# Patient Record
Sex: Female | Born: 1990 | Race: Black or African American | Hispanic: No | Marital: Single | Smoking: Current every day smoker
Health system: Southern US, Community
[De-identification: ages and names within clinical notes are randomized; demographics above are authoritative.]

## PROBLEM LIST (undated history)

## (undated) DIAGNOSIS — Z8619 Personal history of other infectious and parasitic diseases: Secondary | ICD-10-CM

## (undated) DIAGNOSIS — N946 Dysmenorrhea, unspecified: Secondary | ICD-10-CM

## (undated) DIAGNOSIS — R51 Headache: Secondary | ICD-10-CM

## (undated) HISTORY — DX: Dysmenorrhea, unspecified: N94.6

## (undated) HISTORY — PX: TONSILLECTOMY: SUR1361

## (undated) HISTORY — DX: Headache: R51

## (undated) HISTORY — DX: Personal history of other infectious and parasitic diseases: Z86.19

## (undated) HISTORY — PX: KELOID EXCISION: SHX1856

---
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
## 2012-02-23 ENCOUNTER — Other Ambulatory Visit: Payer: 59

## 2012-02-23 MED ORDER — MEDROXYPROGESTERONE ACETATE 150 MG/ML IM SUSP
150.0000 mg | Freq: Once | INTRAMUSCULAR | Status: AC
  Administered 2012-02-23: 150 mg via INTRAMUSCULAR

## 2012-02-23 NOTE — Progress Notes (Unsigned)
Next Depo due-05-16-2012 

## 2012-02-28 ENCOUNTER — Other Ambulatory Visit: Payer: 59

## 2012-05-17 ENCOUNTER — Other Ambulatory Visit (INDEPENDENT_AMBULATORY_CARE_PROVIDER_SITE_OTHER): Payer: 59

## 2012-05-17 DIAGNOSIS — Z3009 Encounter for other general counseling and advice on contraception: Secondary | ICD-10-CM

## 2012-05-17 MED ORDER — MEDROXYPROGESTERONE ACETATE 150 MG/ML IM SUSP
150.0000 mg | Freq: Once | INTRAMUSCULAR | Status: AC
  Administered 2012-05-17: 150 mg via INTRAMUSCULAR

## 2012-05-17 NOTE — Progress Notes (Unsigned)
Next Depo-Provera due 08/08/12, injection given Right deltoid without difficulty.

## 2012-05-18 ENCOUNTER — Other Ambulatory Visit: Payer: 59

## 2012-08-14 ENCOUNTER — Other Ambulatory Visit: Payer: 59

## 2012-08-14 MED ORDER — MEDROXYPROGESTERONE ACETATE 150 MG/ML IM SUSP
150.0000 mg | Freq: Once | INTRAMUSCULAR | Status: AC
  Administered 2012-08-14: 150 mg via INTRAMUSCULAR

## 2012-08-14 NOTE — Progress Notes (Signed)
Depo given next inj due 11-05-12  ld

## 2012-08-16 ENCOUNTER — Other Ambulatory Visit: Payer: 59

## 2013-05-11 ENCOUNTER — Encounter (HOSPITAL_COMMUNITY): Payer: Self-pay | Admitting: General Practice

## 2013-05-11 ENCOUNTER — Inpatient Hospital Stay (HOSPITAL_COMMUNITY)
Admission: AD | Admit: 2013-05-11 | Discharge: 2013-05-11 | Disposition: A | Discharge status: Home / Self Care | Payer: 59 | Source: Ambulatory Visit | Attending: Obstetrics and Gynecology | Admitting: Obstetrics and Gynecology

## 2013-05-11 DIAGNOSIS — Z30431 Encounter for routine checking of intrauterine contraceptive device: Secondary | ICD-10-CM | POA: Insufficient documentation

## 2013-05-11 DIAGNOSIS — N949 Unspecified condition associated with female genital organs and menstrual cycle: Secondary | ICD-10-CM | POA: Insufficient documentation

## 2013-05-11 DIAGNOSIS — N925 Other specified irregular menstruation: Secondary | ICD-10-CM | POA: Insufficient documentation

## 2013-05-11 DIAGNOSIS — R109 Unspecified abdominal pain: Secondary | ICD-10-CM | POA: Insufficient documentation

## 2013-05-11 DIAGNOSIS — N938 Other specified abnormal uterine and vaginal bleeding: Secondary | ICD-10-CM | POA: Insufficient documentation

## 2013-05-11 LAB — WET PREP, GENITAL
Clue Cells Wet Prep HPF POC: NONE SEEN
Trich, Wet Prep: NONE SEEN

## 2013-05-11 LAB — URINALYSIS, ROUTINE W REFLEX MICROSCOPIC
Glucose, UA: NEGATIVE mg/dL
Ketones, ur: NEGATIVE mg/dL
Nitrite: NEGATIVE
pH: 6 (ref 5.0–8.0)

## 2013-05-11 MED ORDER — KETOROLAC TROMETHAMINE 30 MG/ML IJ SOLN
30.0000 mg | Freq: Once | INTRAMUSCULAR | Status: DC
  Filled 2013-05-11: qty 1

## 2013-05-11 MED ORDER — KETOROLAC TROMETHAMINE 10 MG PO TABS: 10.0000 mg | ORAL_TABLET | Freq: Four times a day (QID) | ORAL | Status: AC | PRN

## 2013-05-11 MED ORDER — KETOROLAC TROMETHAMINE 30 MG/ML IJ SOLN
30.0000 mg | Freq: Once | INTRAMUSCULAR | Status: AC
  Administered 2013-05-11: 30 mg via INTRAMUSCULAR

## 2013-05-11 NOTE — MAU Provider Note (Signed)
History   22 yo G0 presented after calling office with persistent cramping, vaginal spotting, and d/c--had Mirena inserted 02/2013, after being on Depo for several years.  Last Depo was 12/2012.  Patient reports she has had cramping since IUD insertion, "just worse this week".  Had no bleeding on Depo--mild spotting this week.  Last IC with consistent partner 2 weeks ago.  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  Denies fever, dysuria, N/V, diarrhea, or any other sx.  Has taken Ibuprophen without benefit.  Reports cramping is intermittent, 7/10 pain scale when it occurs.  Patient Active Problem List   Diagnosis Date Noted  . Dysmenorrhea 12/06/2011  . Headache      Chief Complaint  Patient presents with  . Vaginal Bleeding   HPI:  See above  OB History   Grav Para Term Preterm Abortions TAB SAB Ect Mult Living   0         0      Past Medical History  Diagnosis Date  . Headache(784.0)   . Asthma   . Dysmenorrhea   . H/O varicella     Past Surgical History  Procedure Laterality Date  . Keloid excision      Family History  Problem Relation Age of Onset  . Hypertension Mother   . Iron deficiency Mother   . Anemia Mother   . Stroke Maternal Grandmother   . Hypertension Maternal Grandmother     History  Substance Use Topics  . Smoking status: Never Smoker   . Smokeless tobacco: Never Used  . Alcohol Use: No    Allergies: No Known Allergies  No prescriptions prior to admission    ROS:  Cramping, spotting, d/c Physical Exam   Blood pressure 118/68, pulse 67, temperature 98.3 F (36.8 C), temperature source Oral, resp. rate 18, height 5\' 6"  (1.676 m), weight 160 lb (72.576 kg).  Physical Exam  Chest clear Heart RRR without murmur Abd soft, NT, no rebound or guarding. Pelvic--small amount white d/c, small amount bloody mucus in vault.  Mirena string visible at cervical  os.  Cervix closed, NT, no CMT. Uterus small, NT.  Adnexa without masses, NT Ext WNL  Results for orders placed during the hospital encounter of 05/11/13 (from the past 24 hour(s))  URINALYSIS, ROUTINE W REFLEX MICROSCOPIC     Status: Abnormal   Collection Time    05/11/13  1:15 PM      Result Value Range   Color, Urine YELLOW  YELLOW   APPearance CLEAR  CLEAR   Specific Gravity, Urine 1.015  1.005 - 1.030   pH 6.0  5.0 - 8.0   Glucose, UA NEGATIVE  NEGATIVE mg/dL   Hgb urine dipstick MODERATE (*) NEGATIVE   Bilirubin Urine NEGATIVE  NEGATIVE   Ketones, ur NEGATIVE  NEGATIVE mg/dL   Protein, ur NEGATIVE  NEGATIVE mg/dL   Urobilinogen, UA 0.2  0.0 - 1.0 mg/dL   Nitrite NEGATIVE  NEGATIVE   Leukocytes, UA NEGATIVE  NEGATIVE  URINE MICROSCOPIC-ADD ON     Status: Abnormal   Collection Time    05/11/13  1:15 PM      Result Value Range   Squamous Epithelial / LPF FEW (*) RARE   WBC, UA 0-2  <3 WBC/hpf   RBC / HPF 0-2  <3 RBC/hpf   Bacteria, UA FEW (*) RARE  POCT PREGNANCY, URINE     Status: None  Collection Time    05/11/13  1:42 PM      Result Value Range   Preg Test, Ur NEGATIVE  NEGATIVE  WET PREP, GENITAL     Status: Abnormal   Collection Time    05/11/13  2:10 PM      Result Value Range   Yeast Wet Prep HPF POC NONE SEEN  NONE SEEN   Trich, Wet Prep NONE SEEN  NONE SEEN   Clue Cells Wet Prep HPF POC NONE SEEN  NONE SEEN   WBC, Wet Prep HPF POC FEW (*) NONE SEEN   Received 30 mg Toradol IM with benefit--cramping resolved.  ED Course  Uterine cramping--Mirena since 02/2013 No evidence of infection or migration of IUD. Reassured patient regarding status.  Recommended she give more time for her body to adjust to the Mirena and transition from Depo. She is to call with any increase in pain, any occurrence of fever, etc. Rx Toradol 10 mg q 6 hours prn pain. GC, chlamydia pending at time of d/c. Dr. Normand Sloop in to speak with patient.   Nigel Bridgeman CNM, [place] 05/11/2013  4:51 PM

## 2013-05-11 NOTE — MAU Note (Signed)
Pt states that she had an IUD place in August and over the past week cramping has gotten progressively worse accompanied with vaginal bleeding and an abnormal discharge.

## 2014-03-10 ENCOUNTER — Encounter (HOSPITAL_BASED_OUTPATIENT_CLINIC_OR_DEPARTMENT_OTHER): Payer: Self-pay | Admitting: Emergency Medicine

## 2014-03-10 ENCOUNTER — Emergency Department (HOSPITAL_BASED_OUTPATIENT_CLINIC_OR_DEPARTMENT_OTHER)
Admission: EM | Admit: 2014-03-10 | Discharge: 2014-03-11 | Disposition: A | Discharge status: Home / Self Care | Payer: 59 | Attending: Emergency Medicine | Admitting: Emergency Medicine

## 2014-03-10 DIAGNOSIS — Z8619 Personal history of other infectious and parasitic diseases: Secondary | ICD-10-CM | POA: Diagnosis not present

## 2014-03-10 DIAGNOSIS — R109 Unspecified abdominal pain: Secondary | ICD-10-CM | POA: Insufficient documentation

## 2014-03-10 DIAGNOSIS — N730 Acute parametritis and pelvic cellulitis: Secondary | ICD-10-CM | POA: Diagnosis not present

## 2014-03-10 DIAGNOSIS — Z3202 Encounter for pregnancy test, result negative: Secondary | ICD-10-CM | POA: Insufficient documentation

## 2014-03-10 DIAGNOSIS — F17200 Nicotine dependence, unspecified, uncomplicated: Secondary | ICD-10-CM | POA: Diagnosis not present

## 2014-03-10 DIAGNOSIS — J45909 Unspecified asthma, uncomplicated: Secondary | ICD-10-CM | POA: Insufficient documentation

## 2014-03-10 LAB — CBC WITH DIFFERENTIAL/PLATELET
Basophils Absolute: 0 10*3/uL (ref 0.0–0.1)
Basophils Relative: 0 % (ref 0–1)
EOS PCT: 5 % (ref 0–5)
Eosinophils Absolute: 0.6 10*3/uL (ref 0.0–0.7)
HEMATOCRIT: 38.7 % (ref 36.0–46.0)
Hemoglobin: 12.4 g/dL (ref 12.0–15.0)
LYMPHS ABS: 2.3 10*3/uL (ref 0.7–4.0)
LYMPHS PCT: 23 % (ref 12–46)
MCH: 24.8 pg — ABNORMAL LOW (ref 26.0–34.0)
MCHC: 32 g/dL (ref 30.0–36.0)
MCV: 77.6 fL — ABNORMAL LOW (ref 78.0–100.0)
MONO ABS: 0.7 10*3/uL (ref 0.1–1.0)
MONOS PCT: 7 % (ref 3–12)
NEUTROS ABS: 6.7 10*3/uL (ref 1.7–7.7)
Neutrophils Relative %: 65 % (ref 43–77)
Platelets: 360 10*3/uL (ref 150–400)
RBC: 4.99 MIL/uL (ref 3.87–5.11)
RDW: 17.5 % — ABNORMAL HIGH (ref 11.5–15.5)
WBC: 10.3 10*3/uL (ref 4.0–10.5)

## 2014-03-10 LAB — LIPASE, BLOOD: Lipase: 40 U/L (ref 11–59)

## 2014-03-10 LAB — URINALYSIS, ROUTINE W REFLEX MICROSCOPIC
BILIRUBIN URINE: NEGATIVE
Glucose, UA: NEGATIVE mg/dL
KETONES UR: NEGATIVE mg/dL
Nitrite: NEGATIVE
PH: 6 (ref 5.0–8.0)
Protein, ur: NEGATIVE mg/dL
Specific Gravity, Urine: 1.015 (ref 1.005–1.030)
Urobilinogen, UA: 1 mg/dL (ref 0.0–1.0)

## 2014-03-10 LAB — COMPREHENSIVE METABOLIC PANEL
ALK PHOS: 95 U/L (ref 39–117)
ALT: 10 U/L (ref 0–35)
AST: 17 U/L (ref 0–37)
Albumin: 4.1 g/dL (ref 3.5–5.2)
Anion gap: 14 (ref 5–15)
BILIRUBIN TOTAL: 0.4 mg/dL (ref 0.3–1.2)
BUN: 10 mg/dL (ref 6–23)
CHLORIDE: 102 meq/L (ref 96–112)
CO2: 25 meq/L (ref 19–32)
Calcium: 9.8 mg/dL (ref 8.4–10.5)
Creatinine, Ser: 1 mg/dL (ref 0.50–1.10)
GFR, EST NON AFRICAN AMERICAN: 80 mL/min — AB (ref >90)
Glucose, Bld: 101 mg/dL — ABNORMAL HIGH (ref 70–99)
POTASSIUM: 4.2 meq/L (ref 3.7–5.3)
SODIUM: 141 meq/L (ref 137–147)
TOTAL PROTEIN: 8 g/dL (ref 6.0–8.3)

## 2014-03-10 LAB — URINE MICROSCOPIC-ADD ON

## 2014-03-10 LAB — PREGNANCY, URINE: PREG TEST UR: NEGATIVE

## 2014-03-10 NOTE — ED Notes (Signed)
Pt reports abd pain x2 weeks, worse in the past , states she went to use the bathroom and noted thick pink vaginal discharge. Pt reports she has an IUD in place and does not have regular menstrual periods.

## 2014-03-10 NOTE — ED Provider Notes (Signed)
CSN: [id number]     Arrival date & time 03/10/14  2144 History   This chart was scribed for Graclyn Lawther Smitty Cords, MD by Julian Hy, ED Scribe. The patient was seen in MH08/MH08. The patient's care was started at 11:51 PM.     Chief Complaint  Patient presents with  . Abdominal Pain  . Vaginal Discharge   Patient is a 23 y.o. female presenting with abdominal pain and vaginal discharge. The history is provided by the patient. No language interpreter was used.  Abdominal Pain Pain location:  Suprapubic Pain quality: cramping   Pain radiates to:  Does not radiate Pain severity:  Moderate Onset quality:  Gradual Duration:  2 weeks Timing:  Constant Progression:  Unchanged Chronicity:  New Context: not eating   Relieved by:  Nothing Worsened by:  Nothing tried Ineffective treatments:  None tried Associated symptoms: vaginal discharge   Associated symptoms: no constipation, no diarrhea, no dysuria, no nausea and no vomiting   Vaginal discharge:    Vaginal discharge characteristics: pink.   Severity:  Mild   Onset quality:  Sudden   Timing:  Constant   Progression:  Worsening   Chronicity:  New Risk factors: has not had multiple surgeries   Vaginal Discharge Associated symptoms: abdominal pain   Associated symptoms: no dysuria, no nausea and no vomiting    HPI Comments: Janis Sol is a 23 y.o. female who presents to the Emergency Department complaining of abdominal pain that started two weeks ago. Pt reports her pain has worsened in the past 30 minutes. Pt also reports heavy, bright pink vaginal discharge. Pt reports she has an IUD in place. Pt denies regular menstrual periods. Pt reports recent unprotected sexual encounters. Pt denies personal history of pelvic inflammation. Pt denies history of trichomonosis, gonorrhea or chlamydia. Pt denies pregnancies. IUD in place for 2 years.  No pap smear this year   Past Medical History  Diagnosis Date  . Headache(784.0)   .  Asthma   . Dysmenorrhea   . H/O varicella    Past Surgical History  Procedure Laterality Date  . Keloid excision    . Tonsillectomy     Family History  Problem Relation Age of Onset  . Hypertension Mother   . Iron deficiency Mother   . Anemia Mother   . Stroke Maternal Grandmother   . Hypertension Maternal Grandmother    History  Substance Use Topics  . Smoking status: Current Every Day Smoker    Types: Cigars  . Smokeless tobacco: Never Used  . Alcohol Use: Yes   OB History   Grav Para Term Preterm Abortions TAB SAB Ect Mult Living   0         0     Review of Systems  Gastrointestinal: Positive for abdominal pain. Negative for nausea, vomiting, diarrhea and constipation.  Genitourinary: Positive for vaginal discharge. Negative for dysuria.  All other systems reviewed and are negative.     Allergies  Review of patient's allergies indicates no known allergies.  Home Medications   Prior to Admission medications   Medication Sig Start Date End Date Taking? Authorizing Provider  ketorolac (TORADOL) 10 MG tablet Take 1 tablet (10 mg total) by mouth every 6 (six) hours as needed for pain. 05/11/13   Nigel Bridgeman, CNM  levonorgestrel (MIRENA) 20 MCG/24HR IUD 1 each by Intrauterine route once.    Historical Provider, MD   Triage Vitals: BP 154/90  Pulse 68  Temp(Src) 98.5 F (36.9 C) (  Oral)  Resp 20  Ht 5\' 6"  (1.676 m)  Wt 165 lb 5 oz (74.985 kg)  BMI 26.69 kg/m2  SpO2 100% Physical Exam  Nursing note and vitals reviewed. Constitutional: She is oriented to person, place, and time. She appears well-developed and well-nourished. No distress.  HENT:  Head: Normocephalic and atraumatic.  Mouth/Throat: Oropharynx is clear and moist.  Eyes: Conjunctivae and EOM are normal. Pupils are equal, round, and reactive to light.  Neck: Normal range of motion. Neck supple.  Cardiovascular: Normal rate, regular rhythm and normal heart sounds.   Pulmonary/Chest: Effort normal  and breath sounds normal. No respiratory distress. She has no wheezes. She has no rales.  Abdominal: Soft. Bowel sounds are normal. There is no tenderness. There is no rebound and no guarding.  Genitourinary: Vaginal discharge found.  Chaperone present. Pink and green discharge in the vaginal vault.  Mild CMT  Musculoskeletal: Normal range of motion. She exhibits no edema.  Neurological: She is alert and oriented to person, place, and time. No sensory deficit.  Skin: Skin is warm and dry.  Psychiatric: She has a normal mood and affect. Her behavior is normal.    ED Course  Procedures (including critical care time) DIAGNOSTIC STUDIES: Oxygen Saturation is 100% on RA, normal by my interpretation.    COORDINATION OF CARE: 11:55 PM- Patient informed of current plan for treatment and evaluation and agrees with plan at this time.    Labs Review Labs Reviewed  WET PREP, GENITAL - Abnormal; Notable for the following:    Clue Cells Wet Prep HPF POC MANY (*)    WBC, Wet Prep HPF POC MODERATE (*)    All other components within normal limits  CBC WITH DIFFERENTIAL - Abnormal; Notable for the following:    MCV 77.6 (*)    MCH 24.8 (*)    RDW 17.5 (*)    All other components within normal limits  COMPREHENSIVE METABOLIC PANEL - Abnormal; Notable for the following:    Glucose, Bld 101 (*)    GFR calc non Af Amer 80 (*)    All other components within normal limits  URINALYSIS, ROUTINE W REFLEX MICROSCOPIC - Abnormal; Notable for the following:    APPearance CLOUDY (*)    Hgb urine dipstick LARGE (*)    Leukocytes, UA SMALL (*)    All other components within normal limits  URINE MICROSCOPIC-ADD ON - Abnormal; Notable for the following:    Squamous Epithelial / LPF MANY (*)    Bacteria, UA FEW (*)    All other components within normal limits  GC/CHLAMYDIA PROBE AMP  LIPASE, BLOOD  PREGNANCY, URINE    Imaging Review No results found.   EKG Interpretation None      MDM   Final  diagnoses:  None    Given IUD and discharge present will treat for PID.  IUD must be removed this week.  No sexual activity of any kind until 7 days after all partners treated.  Patient verbalizes understanding and agrees to follow up  I personally performed the services described in this documentation, which was scribed in my presence. The recorded information has been reviewed and is accurate.     Jasmine AweApril K Soriya Worster-Rasch, MD 03/11/14 [phone]

## 2014-03-11 ENCOUNTER — Encounter (HOSPITAL_BASED_OUTPATIENT_CLINIC_OR_DEPARTMENT_OTHER): Payer: Self-pay | Admitting: Emergency Medicine

## 2014-03-11 LAB — WET PREP, GENITAL
TRICH WET PREP: NONE SEEN
YEAST WET PREP: NONE SEEN

## 2014-03-11 MED ORDER — CEFTRIAXONE SODIUM 250 MG IJ SOLR
250.0000 mg | Freq: Once | INTRAMUSCULAR | Status: AC
  Administered 2014-03-11: 250 mg via INTRAMUSCULAR
  Filled 2014-03-11: qty 250

## 2014-03-11 MED ORDER — AZITHROMYCIN 1 G PO PACK
1.0000 g | PACK | Freq: Once | ORAL | Status: AC
  Administered 2014-03-11: 1 g via ORAL
  Filled 2014-03-11: qty 1

## 2014-03-11 MED ORDER — DOXYCYCLINE HYCLATE 100 MG PO CAPS: 100.0000 mg | ORAL_CAPSULE | Freq: Two times a day (BID) | ORAL | Status: AC

## 2014-03-11 MED ORDER — KETOROLAC TROMETHAMINE 60 MG/2ML IM SOLN
60.0000 mg | Freq: Once | INTRAMUSCULAR | Status: AC
  Administered 2014-03-11: 60 mg via INTRAMUSCULAR
  Filled 2014-03-11: qty 2

## 2014-03-11 MED ORDER — NAPROXEN 500 MG PO TABS: 500.0000 mg | ORAL_TABLET | Freq: Two times a day (BID) | ORAL | Status: AC

## 2014-03-11 MED ORDER — LIDOCAINE HCL (PF) 1 % IJ SOLN
INTRAMUSCULAR | Status: AC
  Administered 2014-03-11: 1.5 mL via INTRAMUSCULAR
  Filled 2014-03-11: qty 5

## 2014-03-11 MED ORDER — METRONIDAZOLE 500 MG PO TABS
500.0000 mg | ORAL_TABLET | Freq: Once | ORAL | Status: AC
  Administered 2014-03-11: 500 mg via ORAL
  Filled 2014-03-11: qty 1

## 2014-03-11 NOTE — Discharge Instructions (Signed)
Pelvic Inflammatory Disease °Pelvic inflammatory disease (PID) refers to an infection in some or all of the female organs. The infection can be in the uterus, ovaries, fallopian tubes, or the surrounding tissues in the pelvis. PID can cause abdominal or pelvic pain that comes on suddenly (acute pelvic pain). PID is a serious infection because it can lead to lasting (chronic) pelvic pain or the inability to have children (infertile).  °CAUSES  °The infection is often caused by the normal bacteria found in the vaginal tissues. PID may also be caused by an infection that is spread during sexual contact. PID can also occur following:  °· The birth of a baby.   °· A miscarriage.   °· An abortion.   °· Major pelvic surgery.   °· The use of an intrauterine device (IUD).   °· A sexual assault.   °RISK FACTORS °Certain factors can put a person at higher risk for PID, such as: °· Being younger than 25 years. °· Being sexually active at a young age. °· Using nonbarrier contraception. °· Having multiple sexual partners. °· Having sex with someone who has symptoms of a genital infection. °· Using oral contraception. °Other times, certain behaviors can increase the possibility of getting PID, such as: °· Having sex during your period. °· Using a vaginal douche. °· Having an intrauterine device (IUD) in place. °SYMPTOMS  °· Abdominal or pelvic pain.   °· Fever.   °· Chills.   °· Abnormal vaginal discharge. °· Abnormal uterine bleeding.   °· Unusual pain shortly after finishing your period. °DIAGNOSIS  °Your caregiver will choose some of the following methods to make a diagnosis, such as:  °· Performing a physical exam and history. A pelvic exam typically reveals a very tender uterus and surrounding pelvis.   °· Ordering laboratory tests including a pregnancy test, blood tests, and urine test.  °· Ordering cultures of the vagina and cervix to check for a sexually transmitted infection (STI). °· Performing an ultrasound.    °· Performing a laparoscopic procedure to look inside the pelvis.   °TREATMENT  °· Antibiotic medicines may be prescribed and taken by mouth.   °· Sexual partners may be treated when the infection is caused by a sexually transmitted disease (STD).   °· Hospitalization may be needed to give antibiotics intravenously. °· Surgery may be needed, but this is rare. °It may take weeks until you are completely well. If you are diagnosed with PID, you should also be checked for human immunodeficiency virus (HIV).   °HOME CARE INSTRUCTIONS  °· If given, take your antibiotics as directed. Finish the medicine even if you start to feel better.   °· Only take over-the-counter or prescription medicines for pain, discomfort, or fever as directed by your caregiver.   °· Do not have sexual intercourse until treatment is completed or as directed by your caregiver. If PID is confirmed, your recent sexual partner(s) will need treatment.   °· Keep your follow-up appointments. °SEEK MEDICAL CARE IF:  °· You have increased or abnormal vaginal discharge.   °· You need prescription medicine for your pain.   °· You vomit.   °· You cannot take your medicines.   °· Your partner has an STD.   °SEEK IMMEDIATE MEDICAL CARE IF:  °· You have a fever.   °· You have increased abdominal or pelvic pain.   °· You have chills.   °· You have pain when you urinate.   °· You are not better after 72 hours following treatment.   °MAKE SURE YOU:  °· Understand these instructions. °· Will watch your condition. °· Will get help right away if you are not doing well or get worse. °  Document Released: 07/12/2005 Document Revised: 11/06/2012 Document Reviewed: 07/08/2011 °[employer]® Patient Information ©2015 [employer]. This information is not intended to replace advice given to you by your health care provider. Make sure you discuss any questions you have with your health care provider. ° °

## 2014-03-11 NOTE — ED Notes (Signed)
Pt ambulating independently w/ steady gait on d/c in no acute distress, A&Ox4. D/c instructions reviewed w/ pt and family - pt and family deny any further questions or concerns at present. Rx given x2  

## 2014-03-11 NOTE — ED Notes (Signed)
Pt updated on plan of care, notified that physician would be in shortly to discuss results w/ pt - this RN requested family step out for pt privacy - pt declined stating that her sister could stay in the room during review of results w/ physician. EDP notified of pts wishes.

## 2014-03-12 LAB — GC/CHLAMYDIA PROBE AMP
CT Probe RNA: NEGATIVE
GC Probe RNA: NEGATIVE
